# Patient Record
Sex: Female | Born: 1985 | Race: Black or African American | Hispanic: No | Marital: Single | Smoking: Never smoker
Health system: Southern US, Community
[De-identification: ages and names within clinical notes are randomized; demographics above are authoritative.]

## PROBLEM LIST (undated history)

## (undated) DIAGNOSIS — I10 Essential (primary) hypertension: Secondary | ICD-10-CM

## (undated) HISTORY — PX: TONSILLECTOMY: SUR1361

---

## 2020-07-26 ENCOUNTER — Emergency Department (HOSPITAL_COMMUNITY): Payer: Medicaid Other

## 2020-07-26 ENCOUNTER — Emergency Department (HOSPITAL_COMMUNITY)
Admission: EM | Admit: 2020-07-26 | Discharge: 2020-07-27 | Disposition: A | Discharge status: Home / Self Care | Payer: Medicaid Other | Attending: Emergency Medicine | Admitting: Emergency Medicine

## 2020-07-26 ENCOUNTER — Encounter (HOSPITAL_COMMUNITY): Payer: Self-pay | Admitting: Emergency Medicine

## 2020-07-26 ENCOUNTER — Other Ambulatory Visit: Payer: Self-pay

## 2020-07-26 DIAGNOSIS — R079 Chest pain, unspecified: Secondary | ICD-10-CM | POA: Insufficient documentation

## 2020-07-26 DIAGNOSIS — Z5321 Procedure and treatment not carried out due to patient leaving prior to being seen by health care provider: Secondary | ICD-10-CM | POA: Diagnosis not present

## 2020-07-26 HISTORY — DX: Essential (primary) hypertension: I10

## 2020-07-26 LAB — CBC
HCT: 37.2 % (ref 36.0–46.0)
Hemoglobin: 11.9 g/dL — ABNORMAL LOW (ref 12.0–15.0)
MCH: 28.3 pg (ref 26.0–34.0)
MCHC: 32 g/dL (ref 30.0–36.0)
MCV: 88.6 fL (ref 80.0–100.0)
Platelets: 279 10*3/uL (ref 150–400)
RBC: 4.2 MIL/uL (ref 3.87–5.11)
RDW: 13 % (ref 11.5–15.5)
WBC: 7 10*3/uL (ref 4.0–10.5)
nRBC: 0 % (ref 0.0–0.2)

## 2020-07-26 LAB — I-STAT BETA HCG BLOOD, ED (MC, WL, AP ONLY): I-stat hCG, quantitative: <5 m[IU]/mL (ref <5)

## 2020-07-26 NOTE — ED Triage Notes (Signed)
Pt c/o non-radiating left sided chest pain. Denies shortness of breath, cough, fever, dizziness.

## 2020-07-27 LAB — BASIC METABOLIC PANEL
Anion gap: 6 (ref 5–15)
BUN: 14 mg/dL (ref 6–20)
CO2: 24 mmol/L (ref 22–32)
Calcium: 9 mg/dL (ref 8.9–10.3)
Chloride: 106 mmol/L (ref 98–111)
Creatinine, Ser: 0.9 mg/dL (ref 0.44–1.00)
GFR, Estimated: >60 mL/min (ref >60)
Glucose, Bld: 102 mg/dL — ABNORMAL HIGH (ref 70–99)
Potassium: 3.9 mmol/L (ref 3.5–5.1)
Sodium: 136 mmol/L (ref 135–145)

## 2020-07-27 LAB — TROPONIN I (HIGH SENSITIVITY)
Troponin I (High Sensitivity): 3 ng/L (ref <18)
Troponin I (High Sensitivity): <2 ng/L (ref <18)

## 2020-09-08 ENCOUNTER — Other Ambulatory Visit: Payer: Self-pay

## 2020-09-08 ENCOUNTER — Encounter (HOSPITAL_COMMUNITY): Payer: Self-pay | Admitting: Emergency Medicine

## 2020-09-08 ENCOUNTER — Ambulatory Visit (HOSPITAL_COMMUNITY)
Admission: EM | Admit: 2020-09-08 | Discharge: 2020-09-08 | Disposition: A | Discharge status: Home / Self Care | Payer: Medicaid Other | Attending: Emergency Medicine | Admitting: Emergency Medicine

## 2020-09-08 DIAGNOSIS — Z20822 Contact with and (suspected) exposure to covid-19: Secondary | ICD-10-CM | POA: Insufficient documentation

## 2020-09-08 DIAGNOSIS — B349 Viral infection, unspecified: Secondary | ICD-10-CM

## 2020-09-08 NOTE — ED Provider Notes (Signed)
CHIEF COMPLAINT:   Chief Complaint  Patient presents with  . Generalized Body Aches    SUBJECTIVE/HPI:  HPI A very pleasant 35 y.o.Female presents today with generalized body aches, cough, fever, shortness of breath onset yesterday.  Patient states that she has a daughter that was recently diagnosed with COVID-19.  Patient states she has been able to get her fever down without medications.  Patient does report a history of hypertension, but denies any history of asthma.  Patient does not report any chest pain, palpitations, visual changes.   has a past medical history of Hypertension.  ROS:  Review of Systems See Subjective/HPI Medications, Allergies and Problem List personally reviewed in Epic today OBJECTIVE:   Vitals:   09/08/20 1910  BP: 116/78  Pulse: 85  Resp: 18  Temp: 99 F (37.2 C)  SpO2: 100%    Physical Exam   General: Appears well-developed and well-nourished. No acute distress.  HEENT Head: Normocephalic and atraumatic. Ears: Hearing grossly intact, no drainage or visible deformity.  Nose: No nasal deviation.  Mouth/Throat: No stridor or tracheal deviation.  Non erythematous posterior pharynx noted with clear drainage present.  No white patchy exudate noted. Eyes: Conjunctivae and EOM are normal. No eye drainage or scleral icterus bilaterally.  Neck: Normal range of motion, neck is supple.  Cardiovascular: Normal rate. Regular rhythm; no murmurs, gallops, or rubs.  Pulm/Chest: No respiratory distress. Breath sounds normal bilaterally without wheezes, rhonchi, or rales.  Neurological: Alert and oriented to person, place, and time.  Skin: Skin is warm and dry.  No rashes, lesions, abrasions or bruising noted to skin.   Psychiatric: Normal mood, affect, behavior, and thought content.   Vital signs and nursing note reviewed.   Patient stable and cooperative with examination. PROCEDURES:    LABS/X-RAYS/EKG/MEDS:   No results found for any visits on  09/08/20.  MEDICAL DECISION MAKING:   Patient presents with generalized body aches, cough, fever, shortness of breath onset yesterday.  Patient states that she has a daughter that was recently diagnosed with COVID-19.  Patient states she has been able to get her fever down without medications.  Patient does report a history of hypertension, but denies any history of asthma.  Patient does not report any chest pain, palpitations, visual changes.  Chart review completed.  COVID PCR obtained in clinic today.  Likely, viral illness but unable to rule out COVID-19 without a negative PCR test.  Advised about home treatment and care as outlined in her AVS.  Return as needed.  Patient verbalized understanding and agreed with treatment plan.  Patient stable upon discharge. ASSESSMENT/PLAN:  Viral illness  Exposure to COVID-19 virus   Instructions about new medications and side effects provided.  Plan:   Discharge Instructions     We will call you with any positive results from your COVID-19 testing completed in clinic today.  If you do not receive a phone call from [place] within the next 1 to 2 days, check your MyChart for up-to-date health information related to testing completed in clinic today.  Viral infections do not require antibiotics. For most people this is a self-limiting process and can take anywhere from 7 - 10 days to start feeling better. A cough can last up to 3 weeks. Pay special attention to handwashing as this can help prevent the spread of the virus.   Always read the labels of cough and cold medications as they may contain some of the ingredients below.   Rest, push lots  of fluids (especially water), and utilize supportive care for symptoms.  You may take acetaminophen (Tylenol) every 4-6 hours and ibuprofen every 6-8 hours for muscle pain, joint pain, headaches (you may also alternate these medications).  Mucinex (guaifenesin) may be taken over the counter for cough as needed  can loosen phlegm. Please read the instructions and take as directed.   Sudafed (pseudophedrine) is sold behind the counter and can help reduce nasal pressure; avoid taking this if you have high blood pressure or feel jittery.  Sudafed PE (phenylephrine) can be a helpful, short-term, over-the-counter alternative to limit side effects or if you have high blood pressure.   Flonase nasal spray can help alleviate congestion and sinus pressure.  Many patients choose Afrin as a nasal decongestant; do not use for more than 3 days for risk of rebound (increased symptoms after stopping medication).   Saline nasal sprays or rinses can also help nasal congestion (use bottled or sterile water).  Warm tea with lemon and honey can sooth sore throat and cough, as can cough drops.   Return to clinic for high fever not improving with medications, chest pain, difficulty breathing, non-stop vomiting, or coughing blood. Follow-up with your primary care provider if symptoms do not improve as expected in the next 5-7 days.       A copy of these instructions have been given to the patient or responsible adult who demonstrated the ability to learn, asked appropriate questions, and verbalized understanding of the plan of care.  There were no barriers to learning identified.    Amalia Greenhouse, FNP-C 09/08/20   This note was partially made with the aid of speech-to-text dictation; typographical errors are not intentional.   Amalia Greenhouse, FNP 09/08/20 2002

## 2020-09-08 NOTE — ED Triage Notes (Signed)
Fever, chills, body aches, and sob that started yesterday.  Patient was exposed to covid over the weekend.    Patient has not been vaccinated for covid

## 2020-09-08 NOTE — Discharge Instructions (Addendum)
We will call you with any positive results from your COVID-19 testing completed in clinic today.  If you do not receive a phone call from us within the next 1 to 2 days, check your MyChart for up-to-date health information related to testing completed in clinic today.   Viral infections do not require antibiotics. For most people this is a self-limiting process and can take anywhere from 7 - 10 days to start feeling better. A cough can last up to 3 weeks. Pay special attention to handwashing as this can help prevent the spread of the virus.   Always read the labels of cough and cold medications as they may contain some of the ingredients below.  Rest, push lots of fluids (especially water), and utilize supportive care for symptoms. You may take acetaminophen (Tylenol) every 4-6 hours and ibuprofen every 6-8 hours for muscle pain, joint pain, headaches (you may also alternate these medications). Mucinex (guaifenesin) may be taken over the counter for cough as needed can loosen phlegm. Please read the instructions and take as directed.  Sudafed (pseudophedrine) is sold behind the counter and can help reduce nasal pressure; avoid taking this if you have high blood pressure or feel jittery. Sudafed PE (phenylephrine) can be a helpful, short-term, over-the-counter alternative to limit side effects or if you have high blood pressure.  Flonase nasal spray can help alleviate congestion and sinus pressure. Many patients choose Afrin as a nasal decongestant; do not use for more than 3 days for risk of rebound (increased symptoms after stopping medication).  Saline nasal sprays or rinses can also help nasal congestion (use bottled or sterile water). Warm tea with lemon and honey can sooth sore throat and cough, as can cough drops.   Return to clinic for high fever not improving with medications, chest pain, difficulty breathing, non-stop vomiting, or coughing blood. Follow-up with your primary care provider if  symptoms do not improve as expected in the next 5-7 days.  

## 2020-09-09 LAB — SARS CORONAVIRUS 2 (TAT 6-24 HRS): SARS Coronavirus 2: POSITIVE — AB

## 2020-11-10 ENCOUNTER — Ambulatory Visit: Payer: Medicaid Other | Admitting: Nurse Practitioner

## 2023-02-25 IMAGING — DX DG CHEST 2V
2 series · 2 of 2 positions shown · non-contrast
Comparison: None.

CLINICAL DATA: Left-sided chest pain.  Shortness of breath.

EXAM:
CHEST - 2 VIEW

[w chest pa]
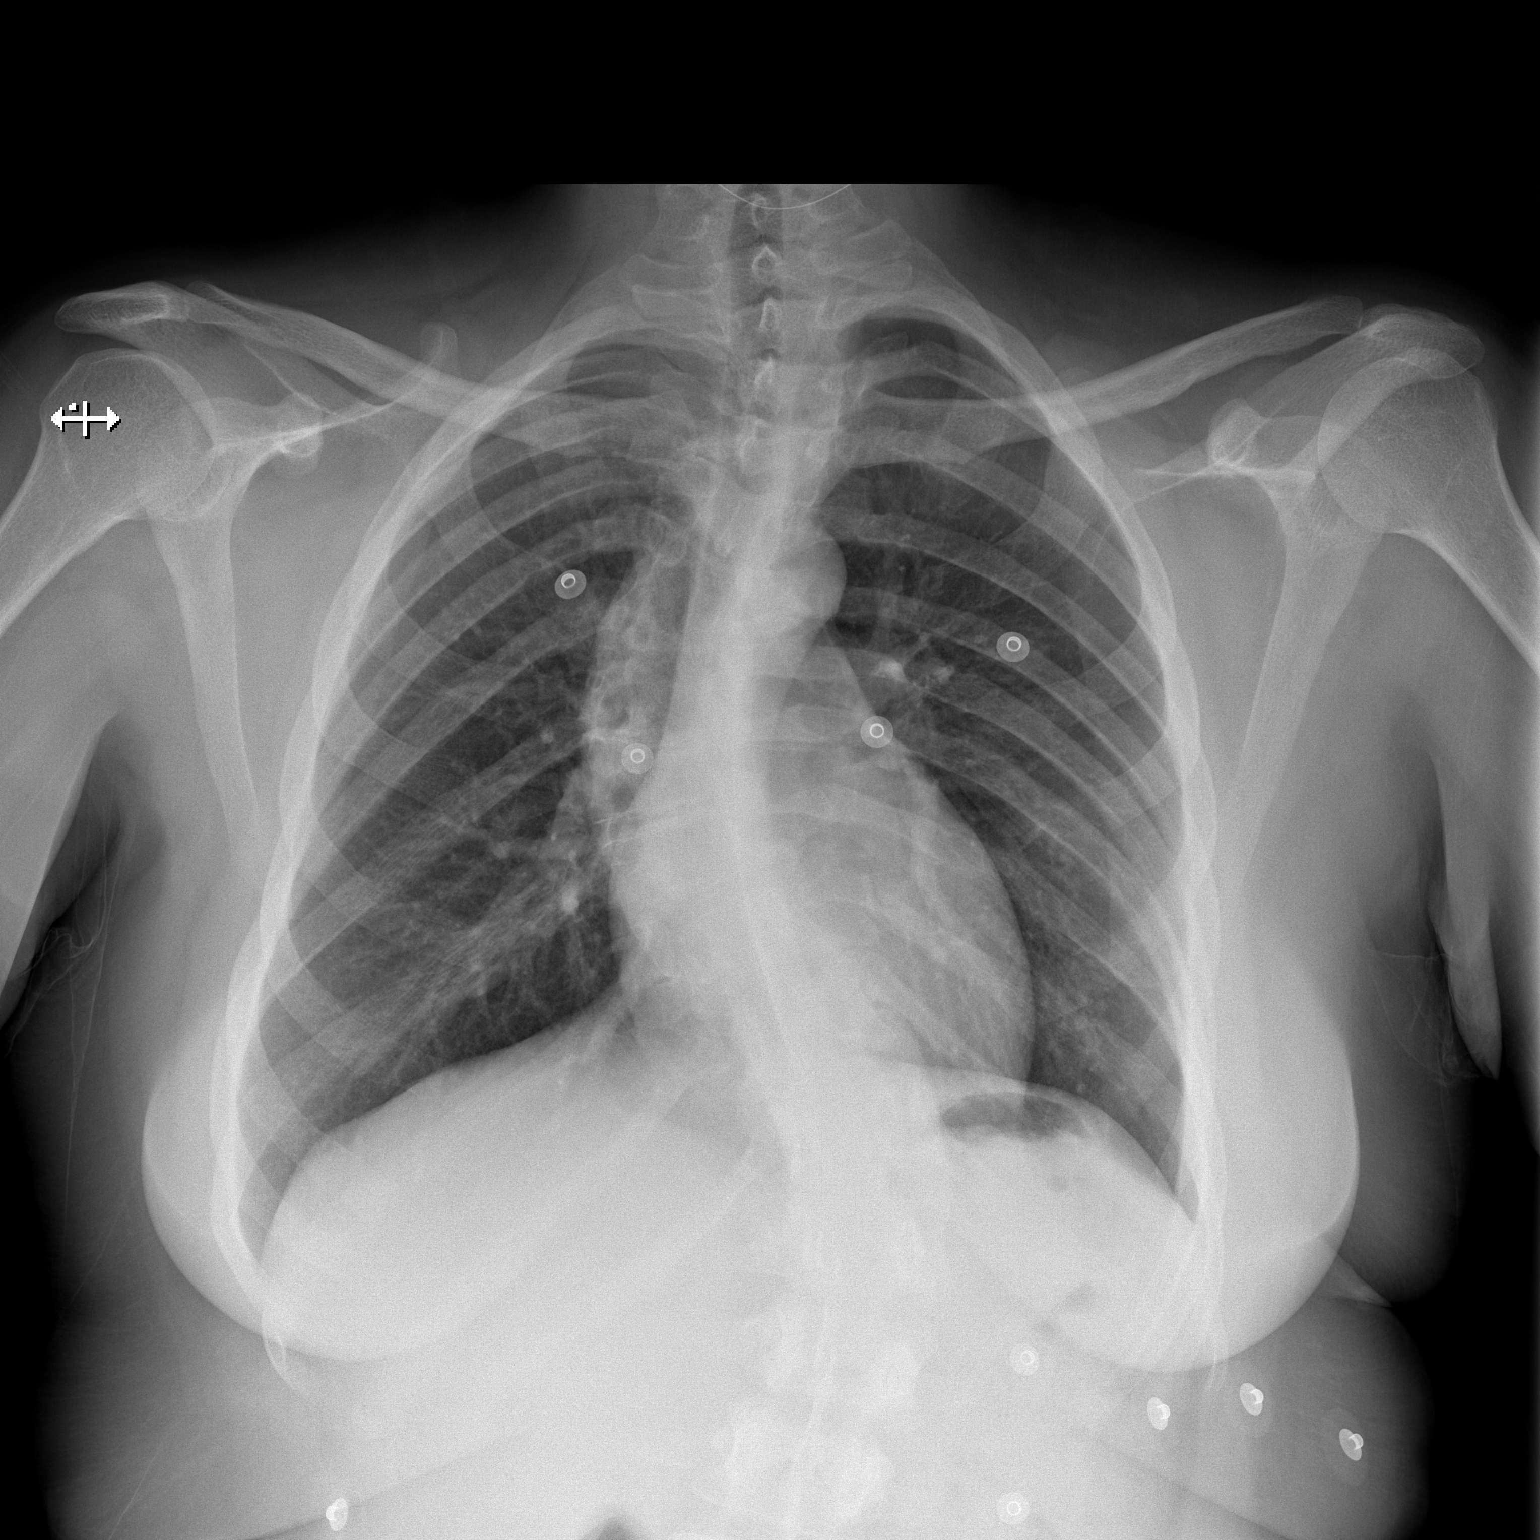

[w chest lat]
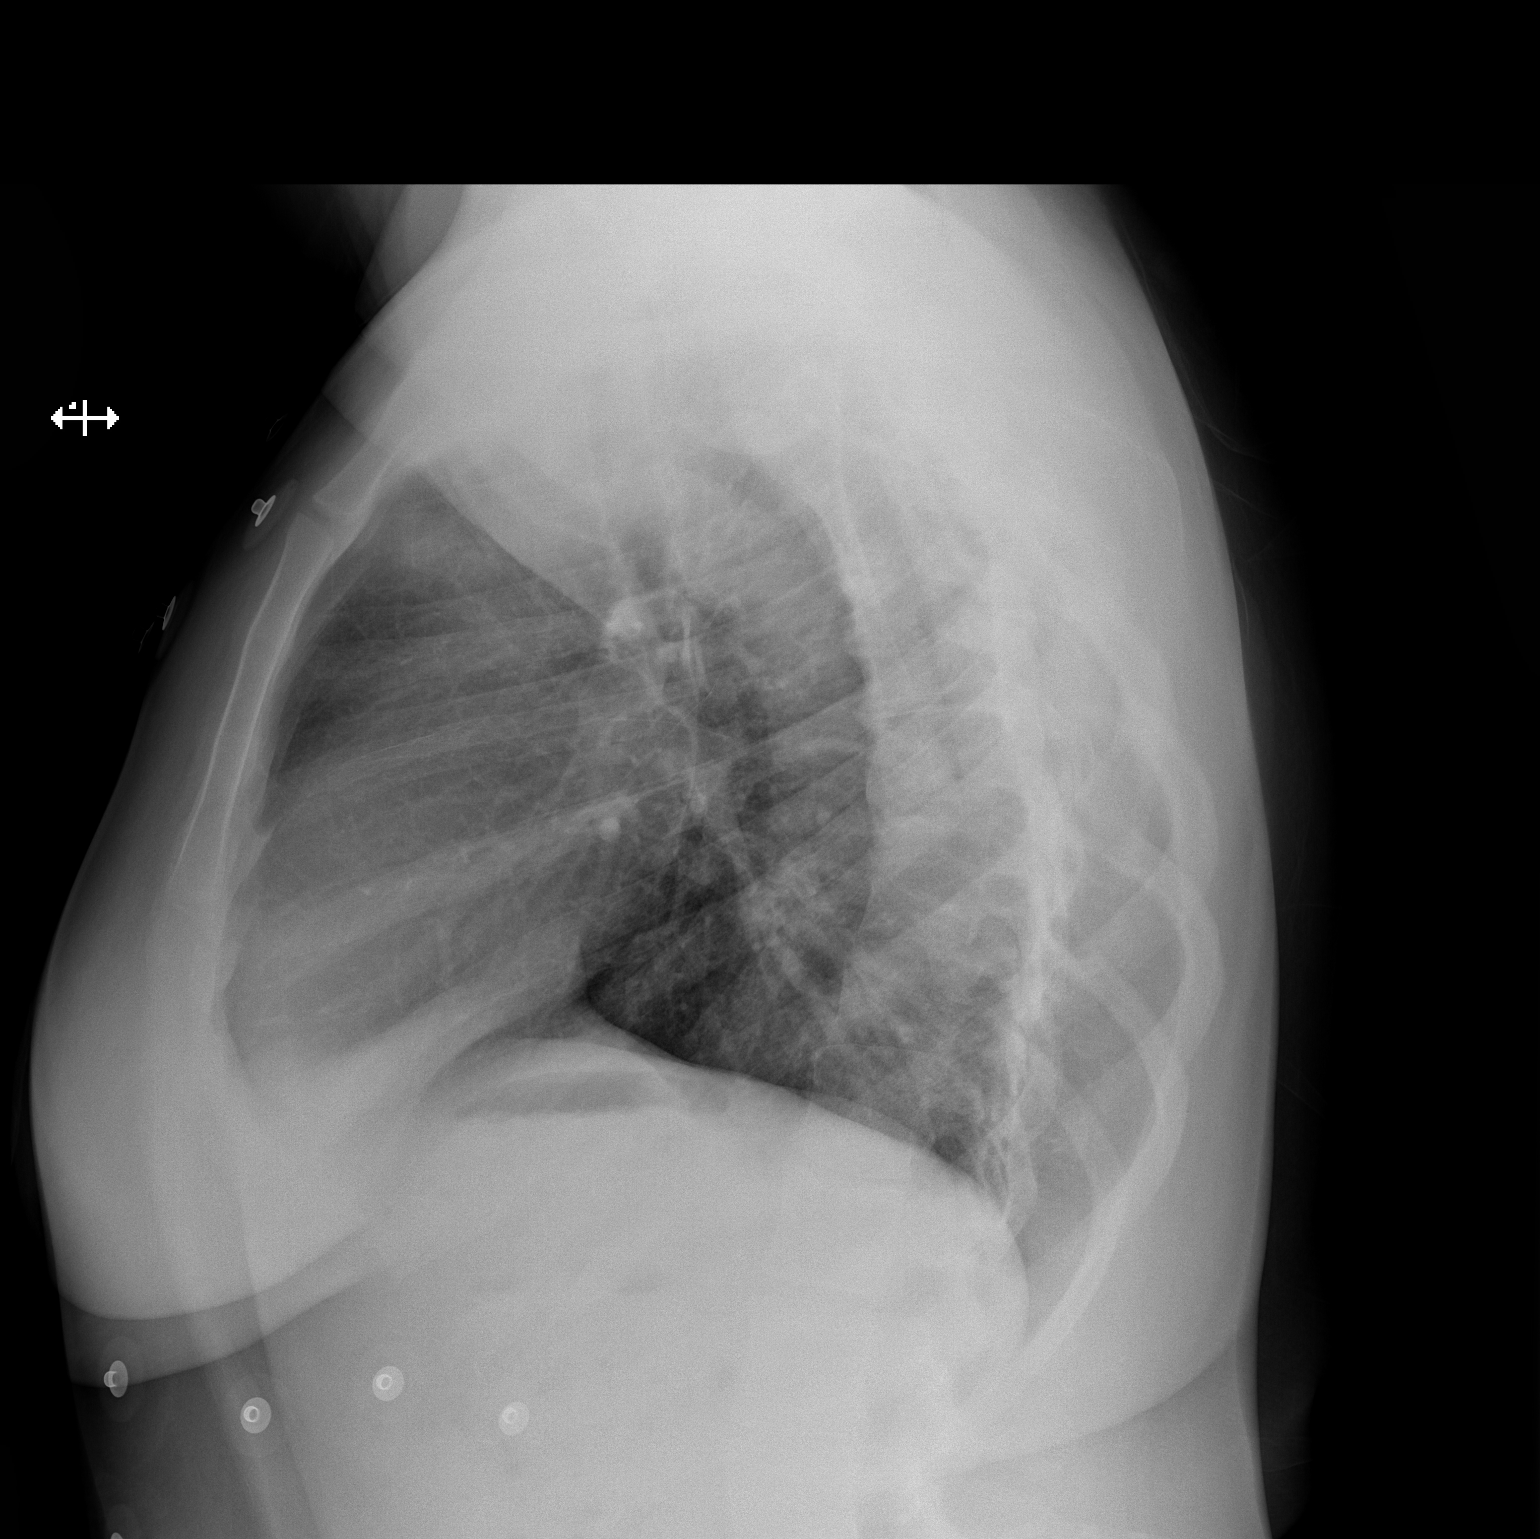

[2 of 2 positions shown; findings below may reference images not displayed]

FINDINGS: The cardiomediastinal contours are normal. The lungs are clear.
Pulmonary vasculature is normal. No consolidation, pleural effusion,
or pneumothorax. Moderate S-shaped scoliotic curvature of the spine.
No acute osseous abnormalities are seen.
IMPRESSION: 1. No acute chest findings.
2. Moderate scoliosis.
